# Patient Record
Sex: Male | Born: 1960 | Race: White | Hispanic: No | Marital: Married | Smoking: Current every day smoker
Health system: Southern US, Community
[De-identification: ages and names within clinical notes are randomized; demographics above are authoritative.]

## PROBLEM LIST (undated history)

## (undated) DIAGNOSIS — L309 Dermatitis, unspecified: Secondary | ICD-10-CM

## (undated) DIAGNOSIS — M1990 Unspecified osteoarthritis, unspecified site: Secondary | ICD-10-CM

## (undated) DIAGNOSIS — G8929 Other chronic pain: Secondary | ICD-10-CM

## (undated) DIAGNOSIS — K2270 Barrett's esophagus without dysplasia: Secondary | ICD-10-CM

## (undated) DIAGNOSIS — F17200 Nicotine dependence, unspecified, uncomplicated: Secondary | ICD-10-CM

## (undated) DIAGNOSIS — K219 Gastro-esophageal reflux disease without esophagitis: Secondary | ICD-10-CM

## (undated) DIAGNOSIS — G43909 Migraine, unspecified, not intractable, without status migrainosus: Secondary | ICD-10-CM

## (undated) DIAGNOSIS — E785 Hyperlipidemia, unspecified: Secondary | ICD-10-CM

## (undated) HISTORY — PX: TONSILLECTOMY: SUR1361

---

## 2005-05-05 ENCOUNTER — Emergency Department: Payer: Self-pay | Admitting: Emergency Medicine

## 2020-05-04 ENCOUNTER — Emergency Department: Payer: BC Managed Care – PPO

## 2020-05-04 ENCOUNTER — Other Ambulatory Visit: Payer: Self-pay

## 2020-05-04 ENCOUNTER — Emergency Department
Admission: EM | Admit: 2020-05-04 | Discharge: 2020-05-04 | Disposition: A | Discharge status: Home / Self Care | Payer: BC Managed Care – PPO | Attending: Emergency Medicine | Admitting: Emergency Medicine

## 2020-05-04 DIAGNOSIS — R519 Headache, unspecified: Secondary | ICD-10-CM

## 2020-05-04 DIAGNOSIS — R42 Dizziness and giddiness: Secondary | ICD-10-CM

## 2020-05-04 DIAGNOSIS — Z720 Tobacco use: Secondary | ICD-10-CM | POA: Insufficient documentation

## 2020-05-04 LAB — URINALYSIS, COMPLETE (UACMP) WITH MICROSCOPIC
Bacteria, UA: NONE SEEN
Bilirubin Urine: NEGATIVE
Glucose, UA: NEGATIVE mg/dL
Hgb urine dipstick: NEGATIVE
Ketones, ur: NEGATIVE mg/dL
Leukocytes,Ua: NEGATIVE
Nitrite: NEGATIVE
Protein, ur: NEGATIVE mg/dL
Specific Gravity, Urine: 1.003 — ABNORMAL LOW (ref 1.005–1.030)
Squamous Epithelial / HPF: NONE SEEN (ref 0–5)
WBC, UA: NONE SEEN WBC/hpf (ref 0–5)
pH: 5 (ref 5.0–8.0)

## 2020-05-04 LAB — BASIC METABOLIC PANEL
Anion gap: 7 (ref 5–15)
BUN: 12 mg/dL (ref 6–20)
CO2: 26 mmol/L (ref 22–32)
Calcium: 8.9 mg/dL (ref 8.9–10.3)
Chloride: 102 mmol/L (ref 98–111)
Creatinine, Ser: 0.76 mg/dL (ref 0.61–1.24)
GFR calc Af Amer: >60 mL/min (ref >60)
GFR calc non Af Amer: >60 mL/min (ref >60)
Glucose, Bld: 116 mg/dL — ABNORMAL HIGH (ref 70–99)
Potassium: 4 mmol/L (ref 3.5–5.1)
Sodium: 135 mmol/L (ref 135–145)

## 2020-05-04 LAB — CBC
HCT: 44.4 % (ref 39.0–52.0)
Hemoglobin: 15.3 g/dL (ref 13.0–17.0)
MCH: 32.1 pg (ref 26.0–34.0)
MCHC: 34.5 g/dL (ref 30.0–36.0)
MCV: 93.3 fL (ref 80.0–100.0)
Platelets: 221 10*3/uL (ref 150–400)
RBC: 4.76 MIL/uL (ref 4.22–5.81)
RDW: 12.5 % (ref 11.5–15.5)
WBC: 4.7 10*3/uL (ref 4.0–10.5)
nRBC: 0 % (ref 0.0–0.2)

## 2020-05-04 MED ORDER — MAGNESIUM SULFATE 2 GM/50ML IV SOLN
2.0000 g | Freq: Once | INTRAVENOUS | Status: AC
  Administered 2020-05-04: 2 g via INTRAVENOUS
  Filled 2020-05-04: qty 50

## 2020-05-04 MED ORDER — LACTATED RINGERS IV BOLUS
1000.0000 mL | Freq: Once | INTRAVENOUS | Status: AC
  Administered 2020-05-04: 1000 mL via INTRAVENOUS

## 2020-05-04 MED ORDER — SODIUM CHLORIDE 0.9% FLUSH: 3.0000 mL | Freq: Once | INTRAVENOUS | Status: DC

## 2020-05-04 MED ORDER — PROCHLORPERAZINE EDISYLATE 10 MG/2ML IJ SOLN
10.0000 mg | Freq: Once | INTRAMUSCULAR | Status: AC
  Administered 2020-05-04: 10 mg via INTRAVENOUS
  Filled 2020-05-04: qty 2

## 2020-05-04 NOTE — ED Notes (Signed)
Contacted pharm for magnesium sulfate to be tubed to ED.

## 2020-05-04 NOTE — ED Triage Notes (Signed)
Pt c/o headache/tightness around head with dizziness for  The past couple of days, denies N/V.the patient is in nAD.

## 2020-05-04 NOTE — ED Provider Notes (Signed)
[facility] Emergency Department Provider Note  ____________________________________________   First MD Initiated Contact with Patient 05/04/20 2002     (approximate)  I have reviewed the triage vital signs and the nursing notes.   HISTORY  Chief Complaint Dizziness   HPI Scott Long is a 59 y.o. male with a past medical history of chronic neck pain from a remote injury, migraines, and tobacco abuse as well as prior gastritis from NSAIDs who presents for assessment of 2 3 days of lightheadedness associated with pressure-like headache.  Patient states his current headache is not like his typical migraine headaches which are more occipital usually.  No clear alleviating or aggravating factors patient does not recall any falls or injuries.  States he has been staying well-hydrated and denies any fevers, vision changes, chest pain, cough, shortness of breath, vomiting, diarrhea, dysuria, rash, or other acute complaints.  No clear alleviating or aggravating factors including Tylenol which does not help.  No prior similar episodes.         History reviewed. No pertinent past medical history.  There are no problems to display for this patient.   Past Surgical History:  Procedure Laterality Date  . TONSILLECTOMY      Prior to Admission medications   Not on File    Allergies Patient has no known allergies.  No family history on file.  Social History Social History   Tobacco Use  . Smoking status: Current Every Day Smoker    Types: Cigarettes  . Smokeless tobacco: Never Used  Substance Use Topics  . Alcohol use: Not Currently  . Drug use: Not Currently    Review of Systems  Review of Systems  Constitutional: Negative for chills and fever.  HENT: Negative for sore throat.   Eyes: Negative for pain.  Respiratory: Negative for cough and stridor.   Cardiovascular: Negative for chest pain.  Gastrointestinal: Negative for vomiting.  Skin:  Negative for rash.  Neurological: Positive for dizziness and headaches. Negative for seizures and loss of consciousness.  Psychiatric/Behavioral: Negative for suicidal ideas.  All other systems reviewed and are negative.     ____________________________________________   PHYSICAL EXAM:  VITAL SIGNS: ED Triage Vitals  Enc Vitals Group     BP 05/04/20 1334 121/84     Pulse Rate 05/04/20 1334 78     Resp 05/04/20 1334 17     Temp 05/04/20 1334 99.2 F (37.3 C)     Temp Source 05/04/20 1334 Oral     SpO2 05/04/20 1334 96 %     Weight 05/04/20 1335 170 lb (77.1 kg)     Height 05/04/20 1335 5\' 9"  (1.753 m)     Head Circumference --      Peak Flow --      Pain Score 05/04/20 1335 0     Pain Loc --      Pain Edu? --      Excl. in GC? --    Vitals:   05/04/20 1334  BP: 121/84  Pulse: 78  Resp: 17  Temp: 99.2 F (37.3 C)  SpO2: 96%   Physical Exam Vitals and nursing note reviewed.  Constitutional:      Appearance: He is well-developed.  HENT:     Head: Normocephalic and atraumatic.     Right Ear: External ear normal.     Left Ear: External ear normal.     Nose: Nose normal.  Eyes:     Conjunctiva/sclera: Conjunctivae normal.  Cardiovascular:  Rate and Rhythm: Normal rate and regular rhythm.     Heart sounds: No murmur heard.   Pulmonary:     Effort: Pulmonary effort is normal. No respiratory distress.     Breath sounds: Normal breath sounds.  Abdominal:     Palpations: Abdomen is soft.     Tenderness: There is no abdominal tenderness.  Musculoskeletal:     Cervical back: Neck supple.  Skin:    General: Skin is warm and dry.     Capillary Refill: Capillary refill takes less than 2 seconds.  Neurological:     General: No focal deficit present.     Mental Status: He is alert.     Cranial nerves II through XII grossly intact.  No pronator drift.  No finger dysmetria.  Patient has 5/5 strength throughout all extremities.  Sensation intact light touch in all  extremities.  Patient was observed to ambulate with steady gait unassisted.  There is no nystagmus.  No dysarthria.  Patient is alert and oriented x4. ____________________________________________   LABS (all labs ordered are listed, but only abnormal results are displayed)  Labs Reviewed  BASIC METABOLIC PANEL - Abnormal; Notable for the following components:      Result Value   Glucose, Bld 116 (*)    All other components within normal limits  URINALYSIS, COMPLETE (UACMP) WITH MICROSCOPIC - Abnormal; Notable for the following components:   Color, Urine COLORLESS (*)    APPearance CLEAR (*)    Specific Gravity, Urine 1.003 (*)    All other components within normal limits  CBC  CBG MONITORING, ED   ____________________________________________  EKG  Normal sinus rhythm with normal axis, unremarkable levels, and no evidence of acute ischemia or other significant underlying rhythm. ____________________________________________  RADIOLOGY  Official radiology report(s): CT Head Wo Contrast  Result Date: 05/04/2020 CLINICAL DATA:  New onset headache EXAM: CT HEAD WITHOUT CONTRAST TECHNIQUE: Contiguous axial images were obtained from the base of the skull through the vertex without intravenous contrast. COMPARISON:  None. FINDINGS: Brain: No evidence of acute infarction, hemorrhage, hydrocephalus, extra-axial collection or mass lesion/mass effect. Cavum septum pellucidum is noted. Vascular: No hyperdense vessel or unexpected calcification. Skull: Normal. Negative for fracture or focal lesion. Sinuses/Orbits: Paranasal sinuses demonstrate mucosal thickening throughout with small air-fluid levels in the right maxillary and sphenoid sinuses. Other: None. IMPRESSION: Changes consistent with sinus inflammatory change. No acute intracranial abnormality is noted. Electronically Signed   By: Alcide Clever M.D.   On: 05/04/2020 20:38     ____________________________________________   PROCEDURES  Procedure(s) performed (including Critical Care):  Procedures   ____________________________________________   INITIAL IMPRESSION / ASSESSMENT AND PLAN / ED COURSE        Overall patient's history, exam, and ED work-up is not consistent with CVA, intracranial bleeding, hypertensive emergency, acute infectious process, traumatic injury, significant metabolic derangement.  No evidence of ischemia on EKG.  Likely tension versus other primary headache disorder.  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  Patient was treated with below-noted therapies.  Given duration of symptoms with stable vital signs, nonfocal neuro exam, and otherwise reassuring work-up I believe he is safe for discharge with plan for close outpatient PCP follow-up.  Patient discharged in stable condition.  Strict return cautions advised and discussed.  Medications  sodium chloride flush (NS) 0.9 % injection 3 mL (3  mLs Intravenous Not Given 05/04/20 2005)  magnesium sulfate IVPB 2 g 50 mL (2 g Intravenous New Bag/Given 05/04/20 2138)  lactated ringers bolus 1,000 mL (1,000 mLs Intravenous New Bag/Given 05/04/20 2052)  prochlorperazine (COMPAZINE) injection 10 mg (10 mg Intravenous Given 05/04/20 2052)           ____________________________________________   FINAL CLINICAL IMPRESSION(S) / ED DIAGNOSES  Final diagnoses:  Dizziness  Nonintractable headache, unspecified chronicity pattern, unspecified headache type  Tobacco abuse     ED Discharge Orders    None       Note:  This document was prepared using Dragon voice recognition software and may include unintentional dictation errors.   Gilles Chiquito, MD 05/04/20 [phone]

## 2020-05-04 NOTE — Discharge Instructions (Addendum)
If you develop any worsening headaches, especially conjunction with passing out, fevers, please return to the ED.

## 2020-05-04 NOTE — ED Notes (Signed)
Pt transported to CT ?

## 2021-06-06 IMAGING — CT CT HEAD W/O CM
3 series · 15 of 47 positions shown, 18 images · non-contrast
Comparison: None.

CLINICAL DATA: New onset headache

EXAM:
CT HEAD WITHOUT CONTRAST
TECHNIQUE: Contiguous axial images were obtained from the base of the skull
through the vertex without intravenous contrast.

[Series 3: head wo · axial · 0.43mm/px · z∈[-145,-20]mm · 9 of 30 slices shown, 12 images]
[im 3/30  brain]
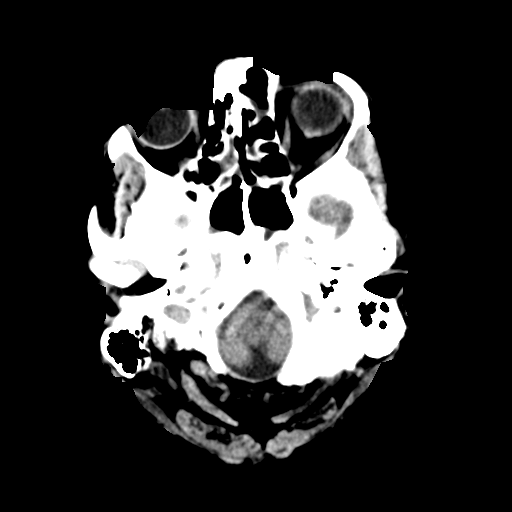
[im 3/30  bone]
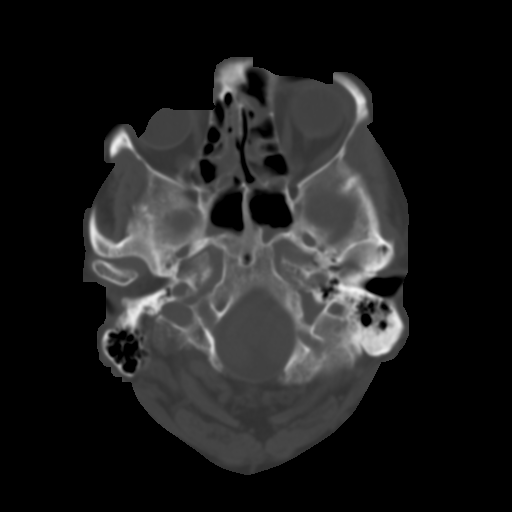
[im 6/30  brain]
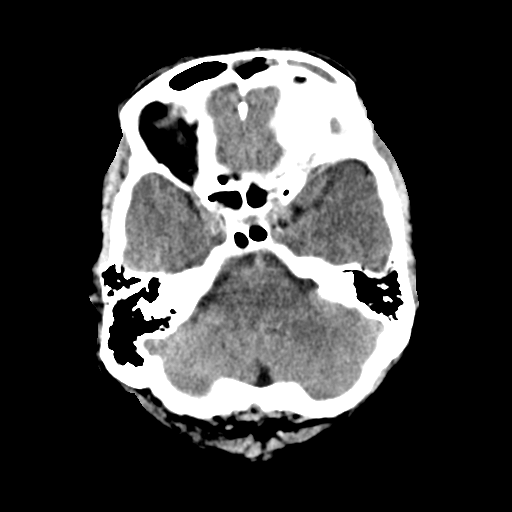
[im 9/30  brain]
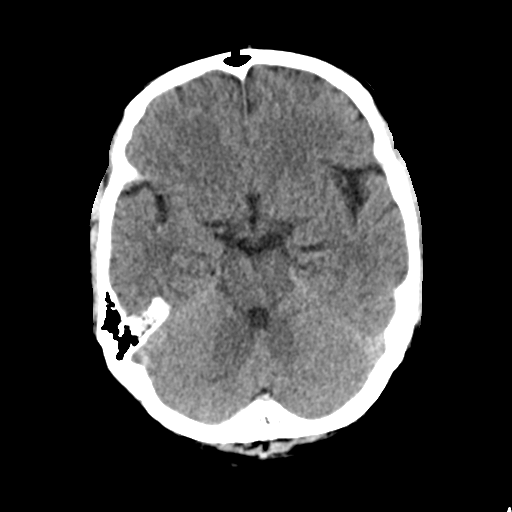
[im 12/30  brain]
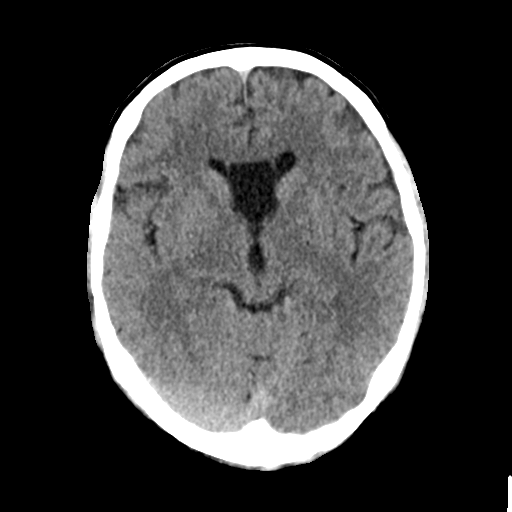
[im 16/30  brain]
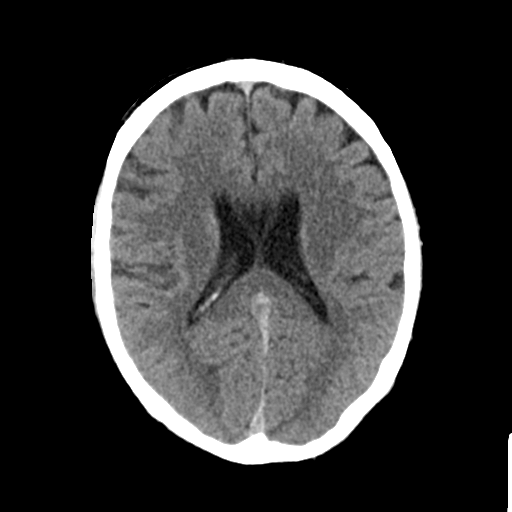
[im 16/30  bone]
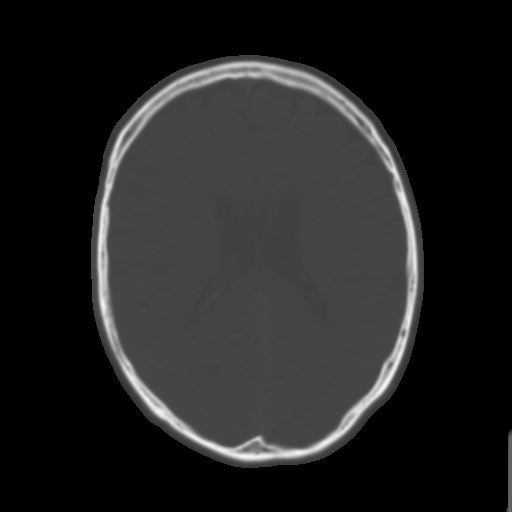
[im 19/30  brain]
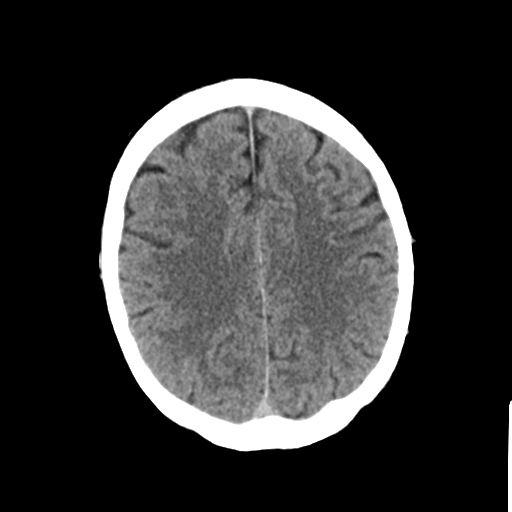
[im 22/30  brain]
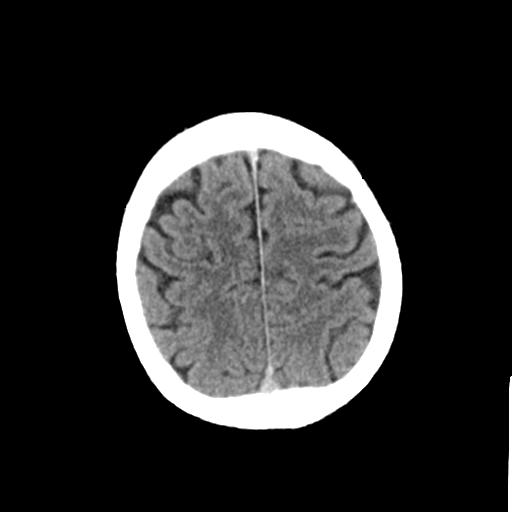
[im 25/30  brain]
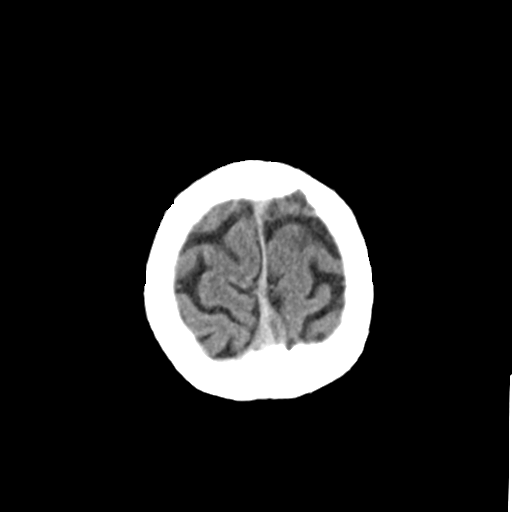
[im 28/30  brain]
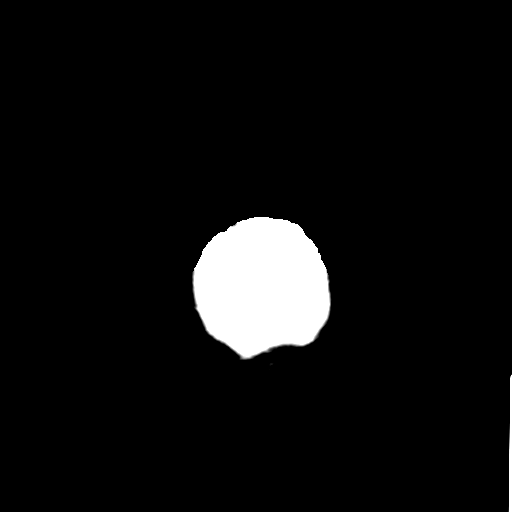
[im 28/30  bone]
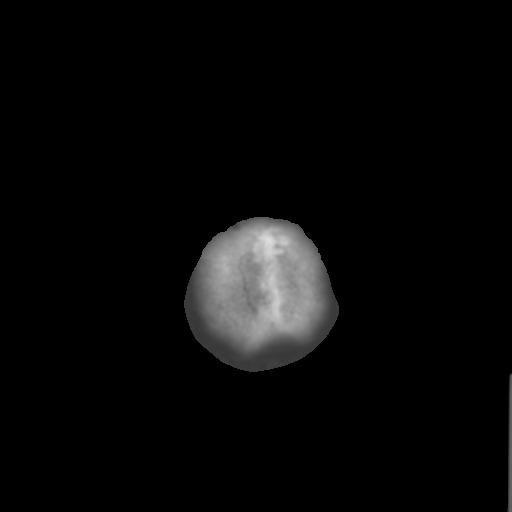

[Series 4: coronal soft tissue · coronal · 0.30mm/px · 3 of 61 slices shown]
[im 21/61  brain]
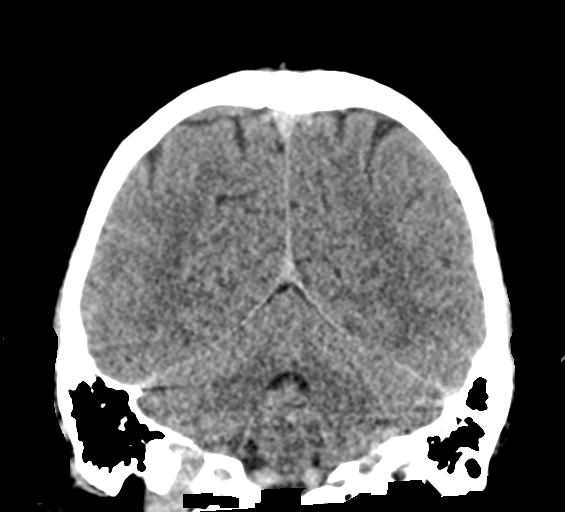
[im 27/61  brain]
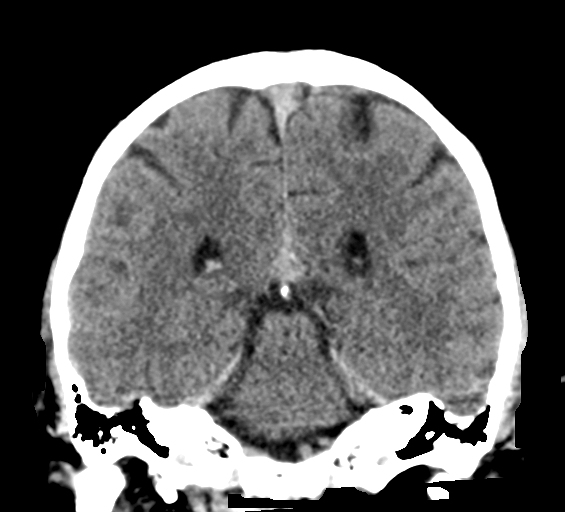
[im 34/61  brain]
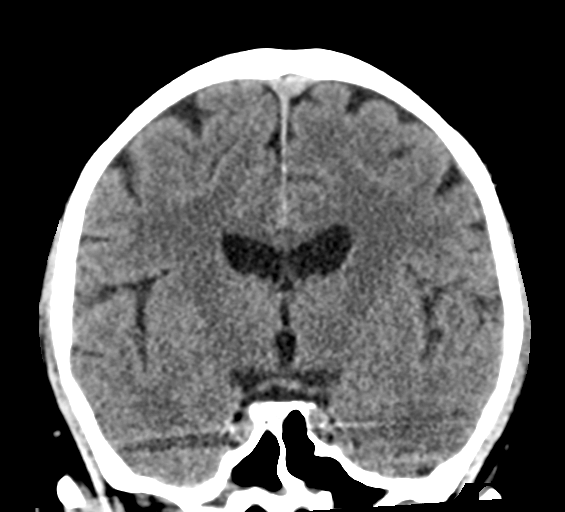

[Series 5: sagittal soft tissue · sagittal · 0.29mm/px · 3 of 52 slices shown]
[im 18/52  brain]
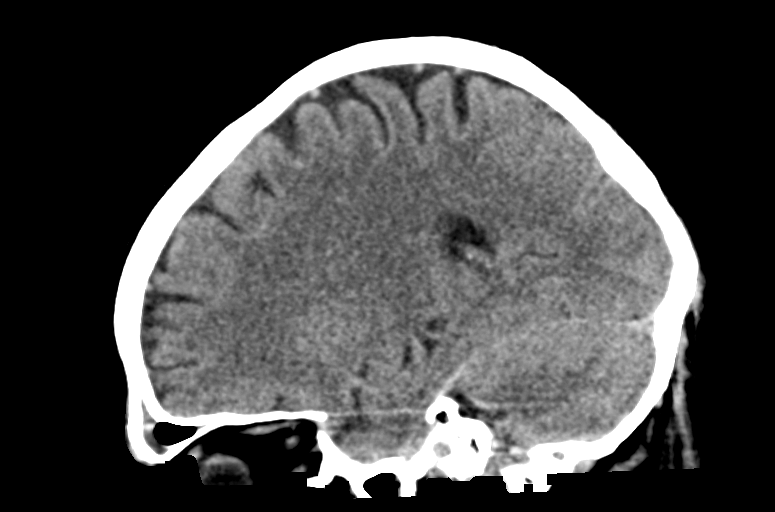
[im 26/52  brain]
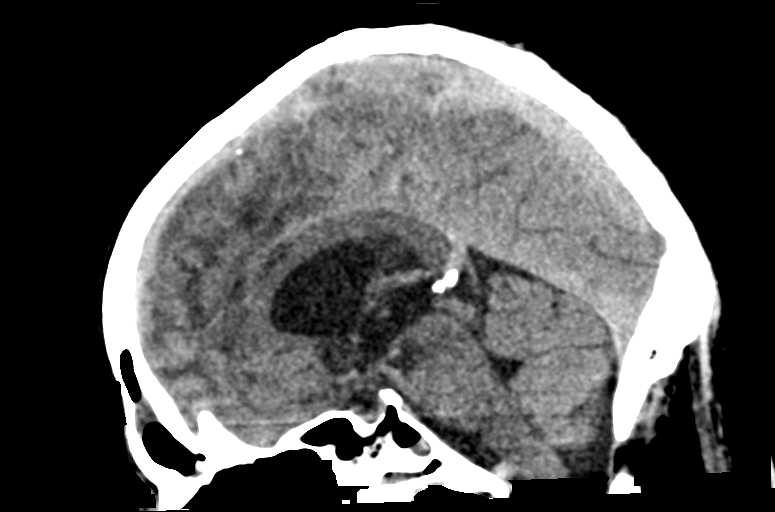
[im 35/52  brain]
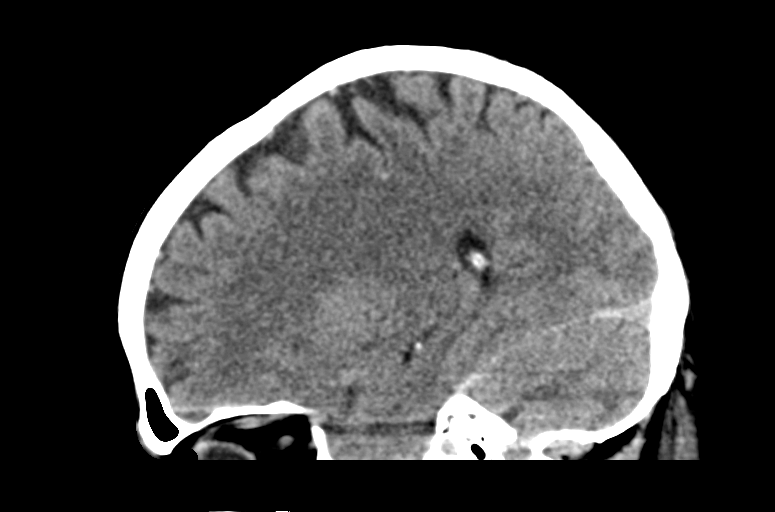

[15 of 47 positions shown; findings below may reference images not displayed]

FINDINGS: Brain: No evidence of acute infarction, hemorrhage, hydrocephalus,
extra-axial collection or mass lesion/mass effect. Cavum septum
pellucidum is noted.

Vascular: No hyperdense vessel or unexpected calcification.

Skull: Normal. Negative for fracture or focal lesion.

Sinuses/Orbits: Paranasal sinuses demonstrate mucosal thickening
throughout with small air-fluid levels in the right maxillary and
sphenoid sinuses.

Other: None.
IMPRESSION: Changes consistent with sinus inflammatory change.

No acute intracranial abnormality is noted.

## 2023-12-18 ENCOUNTER — Ambulatory Visit: Admission: EM | Admit: 2023-12-18 | Discharge: 2023-12-18 | Disposition: A | Discharge status: Home / Self Care

## 2023-12-18 DIAGNOSIS — R21 Rash and other nonspecific skin eruption: Secondary | ICD-10-CM

## 2023-12-18 HISTORY — DX: Unspecified osteoarthritis, unspecified site: M19.90

## 2023-12-18 HISTORY — DX: Hyperlipidemia, unspecified: E78.5

## 2023-12-18 HISTORY — DX: Nicotine dependence, unspecified, uncomplicated: F17.200

## 2023-12-18 HISTORY — DX: Barrett's esophagus without dysplasia: K22.70

## 2023-12-18 HISTORY — DX: Gastro-esophageal reflux disease without esophagitis: K21.9

## 2023-12-18 HISTORY — DX: Migraine, unspecified, not intractable, without status migrainosus: G43.909

## 2023-12-18 HISTORY — DX: Dermatitis, unspecified: L30.9

## 2023-12-18 HISTORY — DX: Other chronic pain: G89.29

## 2023-12-18 MED ORDER — HYDROXYZINE HCL 25 MG PO TABS: 25.0000 mg | ORAL_TABLET | Freq: Every evening | ORAL | 0 refills | Status: AC | PRN

## 2023-12-18 MED ORDER — FLUCONAZOLE 150 MG PO TABS: 150.0000 mg | ORAL_TABLET | ORAL | 0 refills | Status: AC

## 2023-12-18 NOTE — ED Triage Notes (Signed)
 Patient to Urgent Care with complaints of rash present to back/ sides/ arms.  Symptoms started over two weeks ago. Areas very itchy/ burning. 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   Hx eczema. Using gold bond/ washing with different soaps.

## 2023-12-18 NOTE — Discharge Instructions (Addendum)
 Take the Diflucan as directed weekly for 6 weeks.    Take Claritin or Allegra in the morning for itching.    Take the hydroxyzine at bedtime as directed for itching.  Do not drive, operate machinery, drink alcohol, or perform dangerous activities while taking this medication as it may cause drowsiness.  Follow-up with your primary care provider or a dermatologist if your symptoms are not improving.

## 2023-12-18 NOTE — ED Provider Notes (Signed)
 [name]    CSN: [id number] Arrival date & time: 12/18/23  0804      History   Chief Complaint Chief Complaint  Patient presents with   Rash    HPI Scott Long is a 63 y.o. male.  Patient presents with 2 to 3-week history of pruritic rash on his trunk and extremities.  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  He attempted treatment with Goldbond and carburetor fluid applied to the rash.  No drainage, fever, sore throat.  His medical history includes GERD, Barrett's esophagus, hyperlipidemia, osteoarthritis, tobacco use disorder, chronic back pain, chronic neck pain, migraine headache.  The history is provided by the patient and medical records.    Past Medical History:  Diagnosis Date   Barrett esophagus    Chronic back pain    Chronic neck pain    Eczema    GERD (gastroesophageal reflux disease)    Hyperlipidemia    Migraine headache    Osteoarthritis    Tobacco use disorder     There are no active problems to display for this patient.   Past Surgical History:  Procedure Laterality Date   TONSILLECTOMY         Home Medications    Prior to Admission medications   Medication Sig Start Date End Date Taking? Authorizing Provider  fluconazole (DIFLUCAN) 150 MG tablet Take 1 tablet (150 mg total) by mouth once a week for 6 doses. 12/18/23 01/23/24 Yes Mickie Bail, NP  hydrOXYzine (ATARAX) 25 MG tablet Take 1 tablet (25 mg total) by mouth at bedtime as needed for up to 7 days. 12/18/23 12/25/23 Yes Mickie Bail, NP  omeprazole (PRILOSEC) 20 MG capsule Take 20 mg by mouth 2 (two) times daily.    [provider]    Family History History reviewed. No pertinent family history.  Social History Social History   Tobacco Use   Smoking status: Every Day    Types: Cigarettes   Smokeless tobacco: Never  Vaping Use   Vaping status: Never Used  Substance Use Topics   Alcohol use: Not  Currently   Drug use: Not Currently     Allergies   Patient has no known allergies.   Review of Systems Review of Systems  Constitutional:  Negative for chills and fever.  HENT:  Negative for sore throat, trouble swallowing and voice change.   Respiratory:  Negative for cough and shortness of breath.   Skin:  Positive for color change and rash.     Physical Exam Triage Vital Signs ED Triage Vitals [12/18/23 0820]  Encounter Vitals Group     BP 118/80     Systolic BP Percentile      Diastolic BP Percentile      Pulse Rate 75     Resp 18     Temp 97.8 F (36.6 C)     Temp src      SpO2 96 %     Weight      Height      Head Circumference      Peak Flow      Pain Score      Pain Loc      Pain Education      Exclude from Growth Chart    No data found.  Updated Vital Signs BP 118/80   Pulse 75   Temp 97.8 F (36.6 C)   Resp  18   SpO2 96%   Visual Acuity Right Eye Distance:   Left Eye Distance:   Bilateral Distance:    Right Eye Near:   Left Eye Near:    Bilateral Near:     Physical Exam Constitutional:      General: He is not in acute distress. HENT:     Mouth/Throat:     Mouth: Mucous membranes are moist.  Cardiovascular:     Rate and Rhythm: Normal rate and regular rhythm.  Pulmonary:     Effort: Pulmonary effort is normal. No respiratory distress.  Skin:    General: Skin is warm and dry.     Findings: Rash present.     Comments: Annular rash on truck and extremities.  See pictures.   Neurological:     Mental Status: He is alert.           UC Treatments / Results  Labs (all labs ordered are listed, but only abnormal results are displayed) Labs Reviewed - No data to display  EKG   Radiology No results found.  Procedures Procedures (including critical care time)  Medications Ordered in UC Medications - No data to display  Initial Impression / Assessment and Plan / UC Course  I have reviewed the triage vital signs and the  nursing notes.  Pertinent labs & imaging results that were available during my care of the patient were reviewed by me and considered in my medical decision making (see chart for details).    Rash.  The rash appears to possibly be fungal.  Treating today with Diflucan weekly for 6 weeks.  Discussed Claritin or Allegra for itching when he needs to be awake.  Hydroxyzine prescribed for itching when he is able to be drowsy.  Precautions for drowsiness with hydroxyzine discussed.  Education provided on rash and body ringworm.  Instructed him to follow-up with his PCP or dermatologist if he is not improving.  He agrees to plan of care.  Final Clinical Impressions(s) / UC Diagnoses   Final diagnoses:  Rash     Discharge Instructions      Take the Diflucan as directed weekly for 6 weeks.    Take Claritin or Allegra in the morning for itching.    Take the hydroxyzine at bedtime as directed for itching.  Do not drive, operate machinery, drink alcohol, or perform dangerous activities while taking this medication as it may cause drowsiness.  Follow-up with your primary care provider or a dermatologist if your symptoms are not improving.      ED Prescriptions     Medication Sig Dispense Auth. Provider   fluconazole (DIFLUCAN) 150 MG tablet Take 1 tablet (150 mg total) by mouth once a week for 6 doses. 6 tablet Mickie Bail, NP   hydrOXYzine (ATARAX) 25 MG tablet Take 1 tablet (25 mg total) by mouth at bedtime as needed for up to 7 days. 7 tablet Mickie Bail, NP      PDMP not reviewed this encounter.   Mickie Bail, NP 12/18/23 [phone]

## 2024-06-27 ENCOUNTER — Encounter: Payer: Self-pay | Admitting: *Deleted

## 2024-06-27 ENCOUNTER — Emergency Department: Payer: Worker's Compensation

## 2024-06-27 ENCOUNTER — Emergency Department
Admission: EM | Admit: 2024-06-27 | Discharge: 2024-06-27 | Disposition: A | Discharge status: Home / Self Care | Payer: Worker's Compensation | Attending: Emergency Medicine | Admitting: Emergency Medicine

## 2024-06-27 ENCOUNTER — Other Ambulatory Visit: Payer: Self-pay

## 2024-06-27 DIAGNOSIS — M545 Low back pain, unspecified: Secondary | ICD-10-CM | POA: Insufficient documentation

## 2024-06-27 DIAGNOSIS — S46911A Strain of unspecified muscle, fascia and tendon at shoulder and upper arm level, right arm, initial encounter: Secondary | ICD-10-CM | POA: Insufficient documentation

## 2024-06-27 DIAGNOSIS — X509XXA Other and unspecified overexertion or strenuous movements or postures, initial encounter: Secondary | ICD-10-CM | POA: Insufficient documentation

## 2024-06-27 MED ORDER — NAPROXEN 500 MG PO TABS: 500.0000 mg | ORAL_TABLET | Freq: Two times a day (BID) | ORAL | 0 refills | Status: AC

## 2024-06-27 MED ORDER — METHOCARBAMOL 500 MG PO TABS: 500.0000 mg | ORAL_TABLET | Freq: Three times a day (TID) | ORAL | 0 refills | Status: AC | PRN

## 2024-06-27 NOTE — ED Triage Notes (Signed)
 Pt slipped at work and grabbed a handle with right arm to catch himself and he has pain in right shoulder. With radiation down to elbow. Pt also reports back pain from this fall which he began feeling this am.  No LOC

## 2024-06-27 NOTE — Discharge Instructions (Addendum)
 Wear the sling most of the day but take your arm out occasionally and perform gentle range of motion but not to the point of severe pain.  Follow-up with orthopedics if not improved over the week.  Take the medication as prescribed.  Be aware that if you take the methocarbamol  you may feel dizzy or drowsy therefore you should not drive or operate machinery within 8 hours of the last dose.

## 2024-06-27 NOTE — ED Provider Notes (Signed)
 [facility] Provider Note    Event Date/Time   First MD Initiated Contact with Patient 06/27/24 1126     (approximate)   History   No chief complaint on file.   HPI  Scott Long is a 63 y.o. male with history of GERD, Barrett's esophagitis, osteoarthritis, chronic back pain and as listed in EMR presents to the emergency department for treatment and evaluation of right shoulder pain.  He slipped while getting out of the truck and reached up to grab onto a handle with his right arm and to catch himself and now has pain in the shoulder.  Pain radiates down into the upper arm.  He is also having pain in his left lower back. No relief with tylenol.     Physical Exam    Vitals:   06/27/24 1057  BP: 120/84  Pulse: 74  Resp: 16  Temp: 98 F (36.7 C)  SpO2: 96%    General: Awake, no distress.  CV:  Good peripheral perfusion.  Resp:  Normal effort.  Abd:  No distention.  Other:  Right shoulder pain with motion past 90*, unable to perform lift off test. No focal lumbar tenderness.  No drop-off deformity.   ED Results / Procedures / Treatments   Labs (all labs ordered are listed, but only abnormal results are displayed)  Labs Reviewed - No data to display   EKG  Not indicated.    RADIOLOGY  Image and radiology report reviewed and interpreted by me. Radiology report consistent with the same.  Images of the right shoulder and lumbar spine are both negative for acute concerns.  PROCEDURES:  Critical Care performed: No  Procedures   MEDICATIONS ORDERED IN ED:  Medications - No data to display   IMPRESSION / MDM / ASSESSMENT AND PLAN / ED COURSE   I have reviewed the triage note and vital signs. Vital signs stable   Differential diagnosis includes, but is not limited to, shoulder strain, rotator cuff injury, shoulder dislocation, humerus fracture.  Lumbar strain, lumbar vertebral injury, disc injury   Patient's presentation is  most consistent with acute presentation with potential threat to life or bodily function.  63 year old male presenting to the emergency department for treatment and evaluation after slipping and trying to catch himself while at work last night.  See HPI for further details.  On exam has pain with attempt to raise arm past 90 degrees.  He is unable to move his hand behind his back secondary to pain. Grip strength is equal.   Imaging is negative for acute concerns.  Plan will be to place him in a sling and have him ice 20 minutes/h while awake.  Prescriptions for Naprosyn  and methocarbamol  sent to his pharmacy. Work excuse for light duty next week provided.  Outpatient follow-up with orthopedics discussed and ER return precautions given.      FINAL CLINICAL IMPRESSION(S) / ED DIAGNOSES   Final diagnoses:  Shoulder strain, right, initial encounter  Acute left-sided low back pain without sciatica     Rx / DC Orders   ED Discharge Orders          Ordered    naproxen  (NAPROSYN ) 500 MG tablet  2 times daily with meals        06/27/24 1200    methocarbamol  (ROBAXIN ) 500 MG tablet  Every 8 hours PRN        06/27/24 1200  Note:  This document was prepared using Dragon voice recognition software and may include unintentional dictation errors.   Herlinda Kirk NOVAK, FNP 06/27/24 1323    Arlander Charleston, MD 06/27/24 [phone]
